# Patient Record
Sex: Female | Born: 1985 | Race: White | Hispanic: No | Marital: Single | Smoking: Never smoker
Health system: Southern US, Community
[De-identification: ages and names within clinical notes are randomized; demographics above are authoritative.]

## PROBLEM LIST (undated history)

## (undated) DIAGNOSIS — N809 Endometriosis, unspecified: Secondary | ICD-10-CM

## (undated) DIAGNOSIS — K56609 Unspecified intestinal obstruction, unspecified as to partial versus complete obstruction: Secondary | ICD-10-CM

## (undated) HISTORY — PX: APPENDECTOMY: SHX54

## (undated) HISTORY — PX: CHOLECYSTECTOMY: SHX55

---

## 2014-08-16 ENCOUNTER — Encounter (HOSPITAL_BASED_OUTPATIENT_CLINIC_OR_DEPARTMENT_OTHER): Payer: Self-pay | Admitting: Emergency Medicine

## 2014-08-16 ENCOUNTER — Emergency Department (HOSPITAL_BASED_OUTPATIENT_CLINIC_OR_DEPARTMENT_OTHER)
Admission: EM | Admit: 2014-08-16 | Discharge: 2014-08-16 | Disposition: A | Discharge status: Home / Self Care | Payer: Managed Care, Other (non HMO) | Attending: Emergency Medicine | Admitting: Emergency Medicine

## 2014-08-16 ENCOUNTER — Emergency Department (HOSPITAL_BASED_OUTPATIENT_CLINIC_OR_DEPARTMENT_OTHER): Payer: Managed Care, Other (non HMO)

## 2014-08-16 DIAGNOSIS — Z87448 Personal history of other diseases of urinary system: Secondary | ICD-10-CM | POA: Diagnosis not present

## 2014-08-16 DIAGNOSIS — J40 Bronchitis, not specified as acute or chronic: Secondary | ICD-10-CM | POA: Insufficient documentation

## 2014-08-16 DIAGNOSIS — Z8719 Personal history of other diseases of the digestive system: Secondary | ICD-10-CM | POA: Diagnosis not present

## 2014-08-16 DIAGNOSIS — R05 Cough: Secondary | ICD-10-CM | POA: Diagnosis present

## 2014-08-16 HISTORY — DX: Unspecified intestinal obstruction, unspecified as to partial versus complete obstruction: K56.609

## 2014-08-16 HISTORY — DX: Endometriosis, unspecified: N80.9

## 2014-08-16 MED ORDER — BENZONATATE 100 MG PO CAPS: 100.0000 mg | ORAL_CAPSULE | Freq: Three times a day (TID) | ORAL | Status: AC

## 2014-08-16 MED ORDER — HYDROCOD POLST-CHLORPHEN POLST 10-8 MG/5ML PO LQCR: 5.0000 mL | Freq: Two times a day (BID) | ORAL | Status: AC | PRN

## 2014-08-16 MED ORDER — ALBUTEROL SULFATE HFA 108 (90 BASE) MCG/ACT IN AERS: 2.0000 | INHALATION_SPRAY | RESPIRATORY_TRACT | Status: AC | PRN

## 2014-08-16 MED ORDER — ALBUTEROL SULFATE (2.5 MG/3ML) 0.083% IN NEBU
5.0000 mg | INHALATION_SOLUTION | Freq: Once | RESPIRATORY_TRACT | Status: AC
  Administered 2014-08-16: 5 mg via RESPIRATORY_TRACT
  Filled 2014-08-16: qty 6

## 2014-08-16 NOTE — ED Notes (Signed)
States she has been intermittently ill over the past month with cough and flu.  Has been treated by PMD in [place].Symptoms have worsened over the past few days and cough has been severe since last night.

## 2014-08-16 NOTE — ED Provider Notes (Signed)
CSN: [id number]     Arrival date & time 08/16/14  [id number] History   First MD Initiated Contact with Patient 08/16/14 1046     Chief Complaint  Patient presents with  . Cough     (Consider location/radiation/quality/duration/timing/severity/associated sxs/prior Treatment) HPI Pt presenting with c/o cough over the past 1-2 months.  She is from [place], traveling for work here.  She states her illness began with cough and she was treated with antibiotics which did not help. She has been intermittently feeling better, but after making the trip to [place] several days ago her cough has increased.  She feels she is not able to take a deep breath without feeling the need to cough.  No fever/chills.  There are no other associated systemic symptoms, there are no other alleviating or modifying factors.   Past Medical History  Diagnosis Date  . Bowel obstruction   . Endometriosis    Past Surgical History  Procedure Laterality Date  . Cholecystectomy    . Appendectomy     No family history on file. History  Substance Use Topics  . Smoking status: Never Smoker   . Smokeless tobacco: Not on file  . Alcohol Use: Yes     Comment: occasional   OB History   Grav Para Term Preterm Abortions TAB SAB Ect Mult Living                 Review of Systems ROS reviewed and all otherwise negative except for mentioned in HPI    Allergies  Prednisone and Sulfa antibiotics  Home Medications   Prior to Admission medications   Medication Sig Start Date End Date Taking? Authorizing Provider  ALPRAZolam (XANAX PO) Take by mouth as needed.   Yes Historical Provider, MD  ClonazePAM (KLONOPIN PO) Take by mouth as needed.   Yes Historical Provider, MD  CLONIDINE HCL PO Take by mouth as needed.   Yes Historical Provider, MD  albuterol (PROVENTIL HFA;VENTOLIN HFA) 108 (90 BASE) MCG/ACT inhaler Inhale 2 puffs into the lungs every 4 (four) hours as needed for wheezing or shortness of breath. 08/16/14   Ethelda ChickMartha K  Linker, MD  benzonatate (TESSALON) 100 MG capsule Take 1 capsule (100 mg total) by mouth every 8 (eight) hours. 08/16/14   Ethelda ChickMartha K Linker, MD  chlorpheniramine-HYDROcodone (TUSSIONEX PENNKINETIC ER) 10-8 MG/5ML LQCR Take 5 mLs by mouth every 12 (twelve) hours as needed for cough. 08/16/14   Ethelda ChickMartha K Linker, MD   BP 121/79  Pulse 88  Temp(Src) 97.9 F (36.6 C) (Oral)  Resp 18  Ht 5\' 6"  (1.676 m)  Wt 160 lb (72.576 kg)  BMI 25.84 kg/m2  SpO2 98% Vitals reviewed Physical Exam Physical Examination: General appearance - alert, well appearing, and in no distress Mental status - alert, oriented to person, place, and time Eyes - no conjunctival injection, no scleral icterus Mouth - mucous membranes moist, pharynx normal without lesions Chest - clear to auscultation, no wheezes, rales or rhonchi, symmetric air entry, frequent dry coughing, normal respiratory rate Heart - normal rate, regular rhythm, normal S1, S2, no murmurs, rubs, clicks or gallops Abdomen - soft, nontender, nondistended, no masses or organomegaly Extremities - peripheral pulses normal, no pedal edema, no clubbing or cyanosis Skin - normal coloration and turgor, no rashes  ED Course  Procedures (including critical care time) Labs Review Labs Reviewed - No data to display  Imaging Review Dg Chest 2 View  08/16/2014   CLINICAL DATA:  Cough and congestion.  EXAM: CHEST  2 VIEW  COMPARISON:  None.  FINDINGS: 38 degrees of dextroconvex thoracic scoliosis as measured between T7 and T12.  The lungs appear clear. Cardiac and mediastinal contours normal. No pleural effusion identified.  IMPRESSION: 1. Dextroconvex thoracic scoliosis. Otherwise, no significant abnormalities are observed.   Electronically Signed   By: Herbie BaltimoreWalt  Liebkemann M.D.   On: 08/16/2014 10:23     EKG Interpretation None      MDM   Final diagnoses:  Bronchitis    Pt presenting with c/o cough over the past 4-6 weeks.  Pt initially treated with abx, CXR  shows no sign of pneumonia or other acute abnormalities.   Xray images reviewed and interpreted by me as well.  Pt is from [place], but doubt histoplasmosis based on nontoxic appearance and normal cxr findings.  She has a normal exam, no wheezing, but does have frequent cough.  No tachypnea or hypoxia.  Pt treated with albuterol neb in the ED which did provided some relief.  rx for tussionex and tessalon perles as well as albuterol MDI for control of symptoms.  Discharged with strict return precautions.  Pt agreeable with plan.    Ethelda ChickMartha K Linker, MD 08/16/14 [phone]

## 2014-08-16 NOTE — ED Notes (Signed)
MD at bedside. 

## 2014-08-16 NOTE — Discharge Instructions (Signed)
Return to the ED with any concerns including difficulty breathing despite using albuterol inhaler 2 puffs every 4 hours, chest pain, fainting, vomiting and not able to keep down liquids, decreased level of alertness/lethargy, or any other alarming symptoms

## 2014-12-01 IMAGING — CR DG CHEST 2V
2 series · 2 of 2 positions shown · non-contrast
Comparison: None.

CLINICAL DATA: Cough and congestion.

EXAM:
CHEST  2 VIEW

[w chest pa]
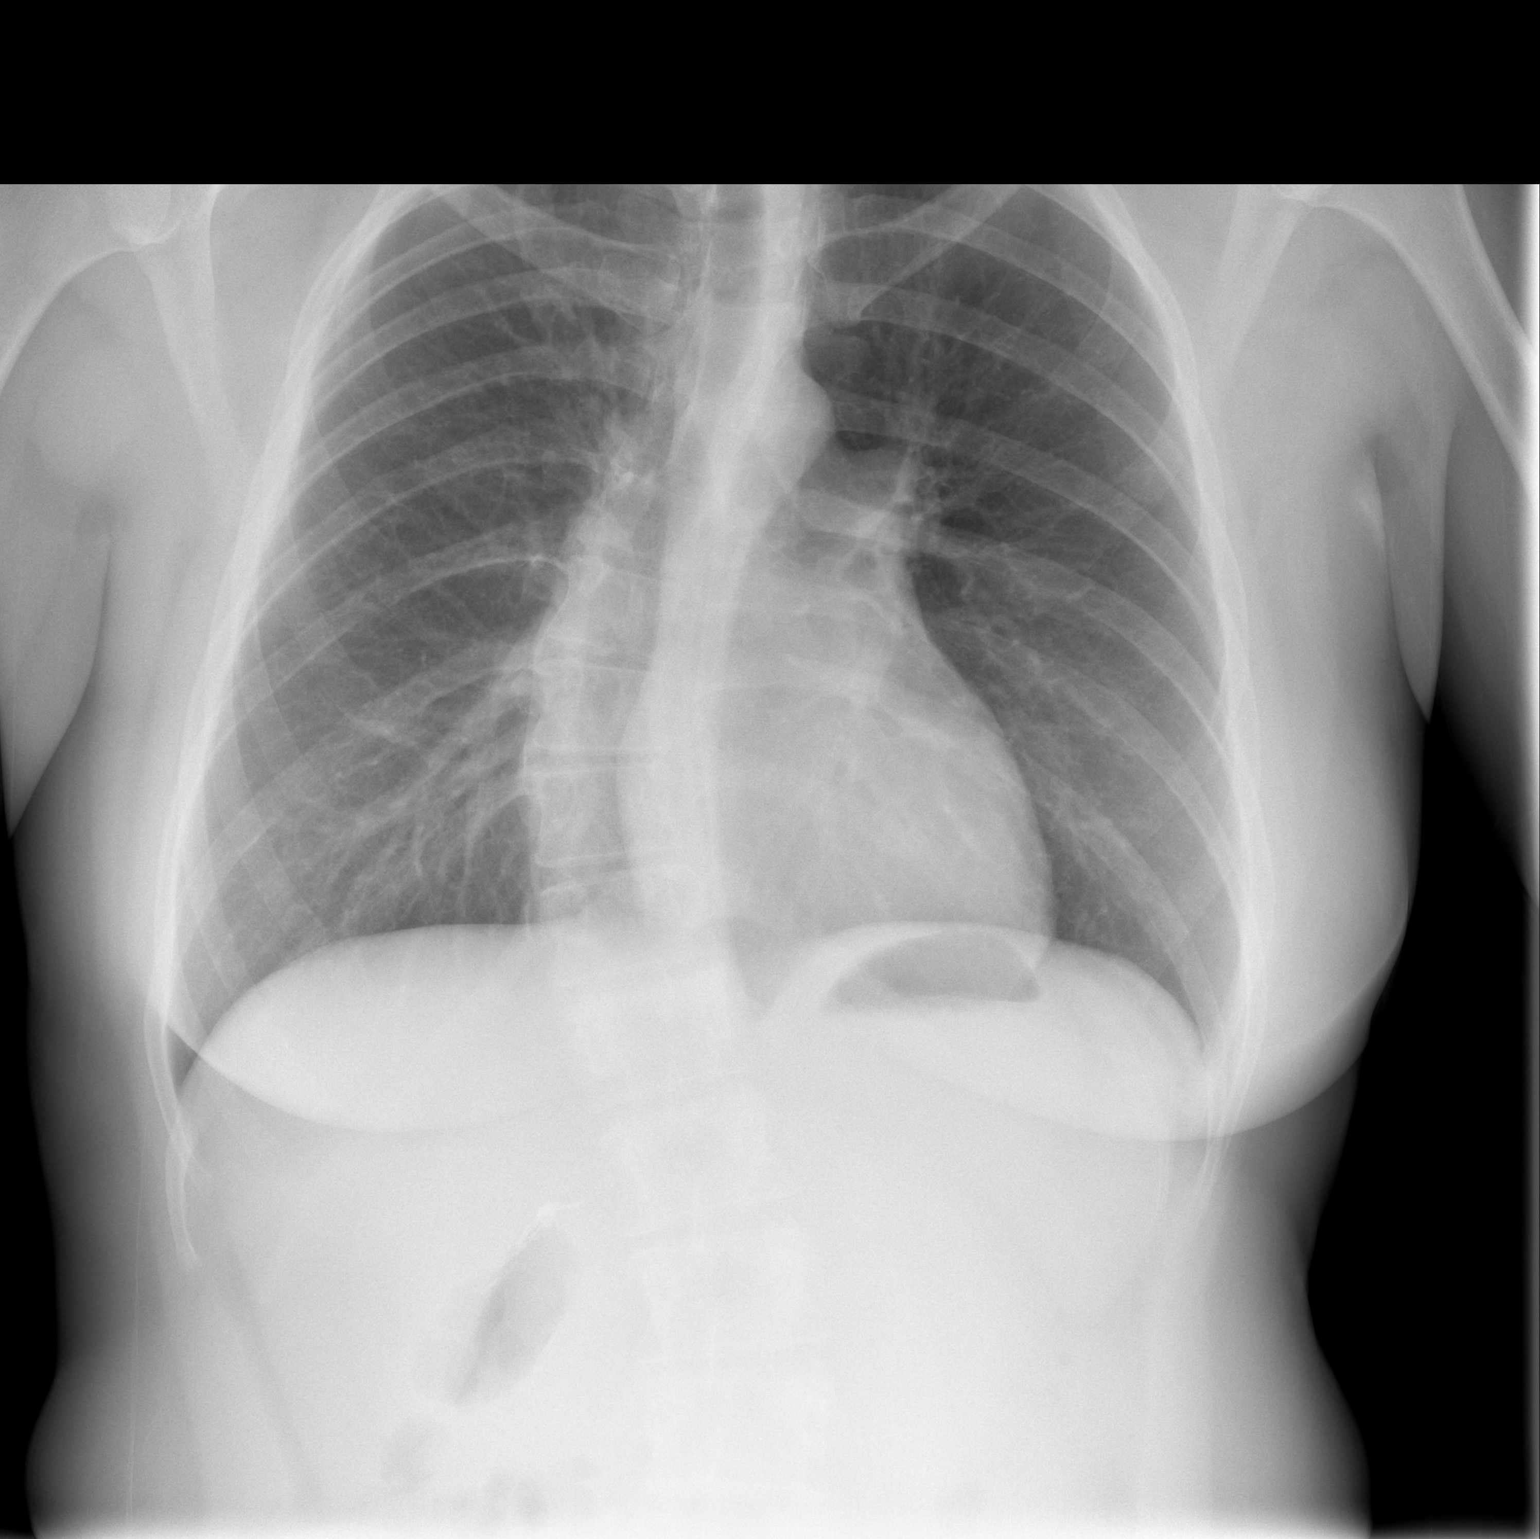

[w chest lat]
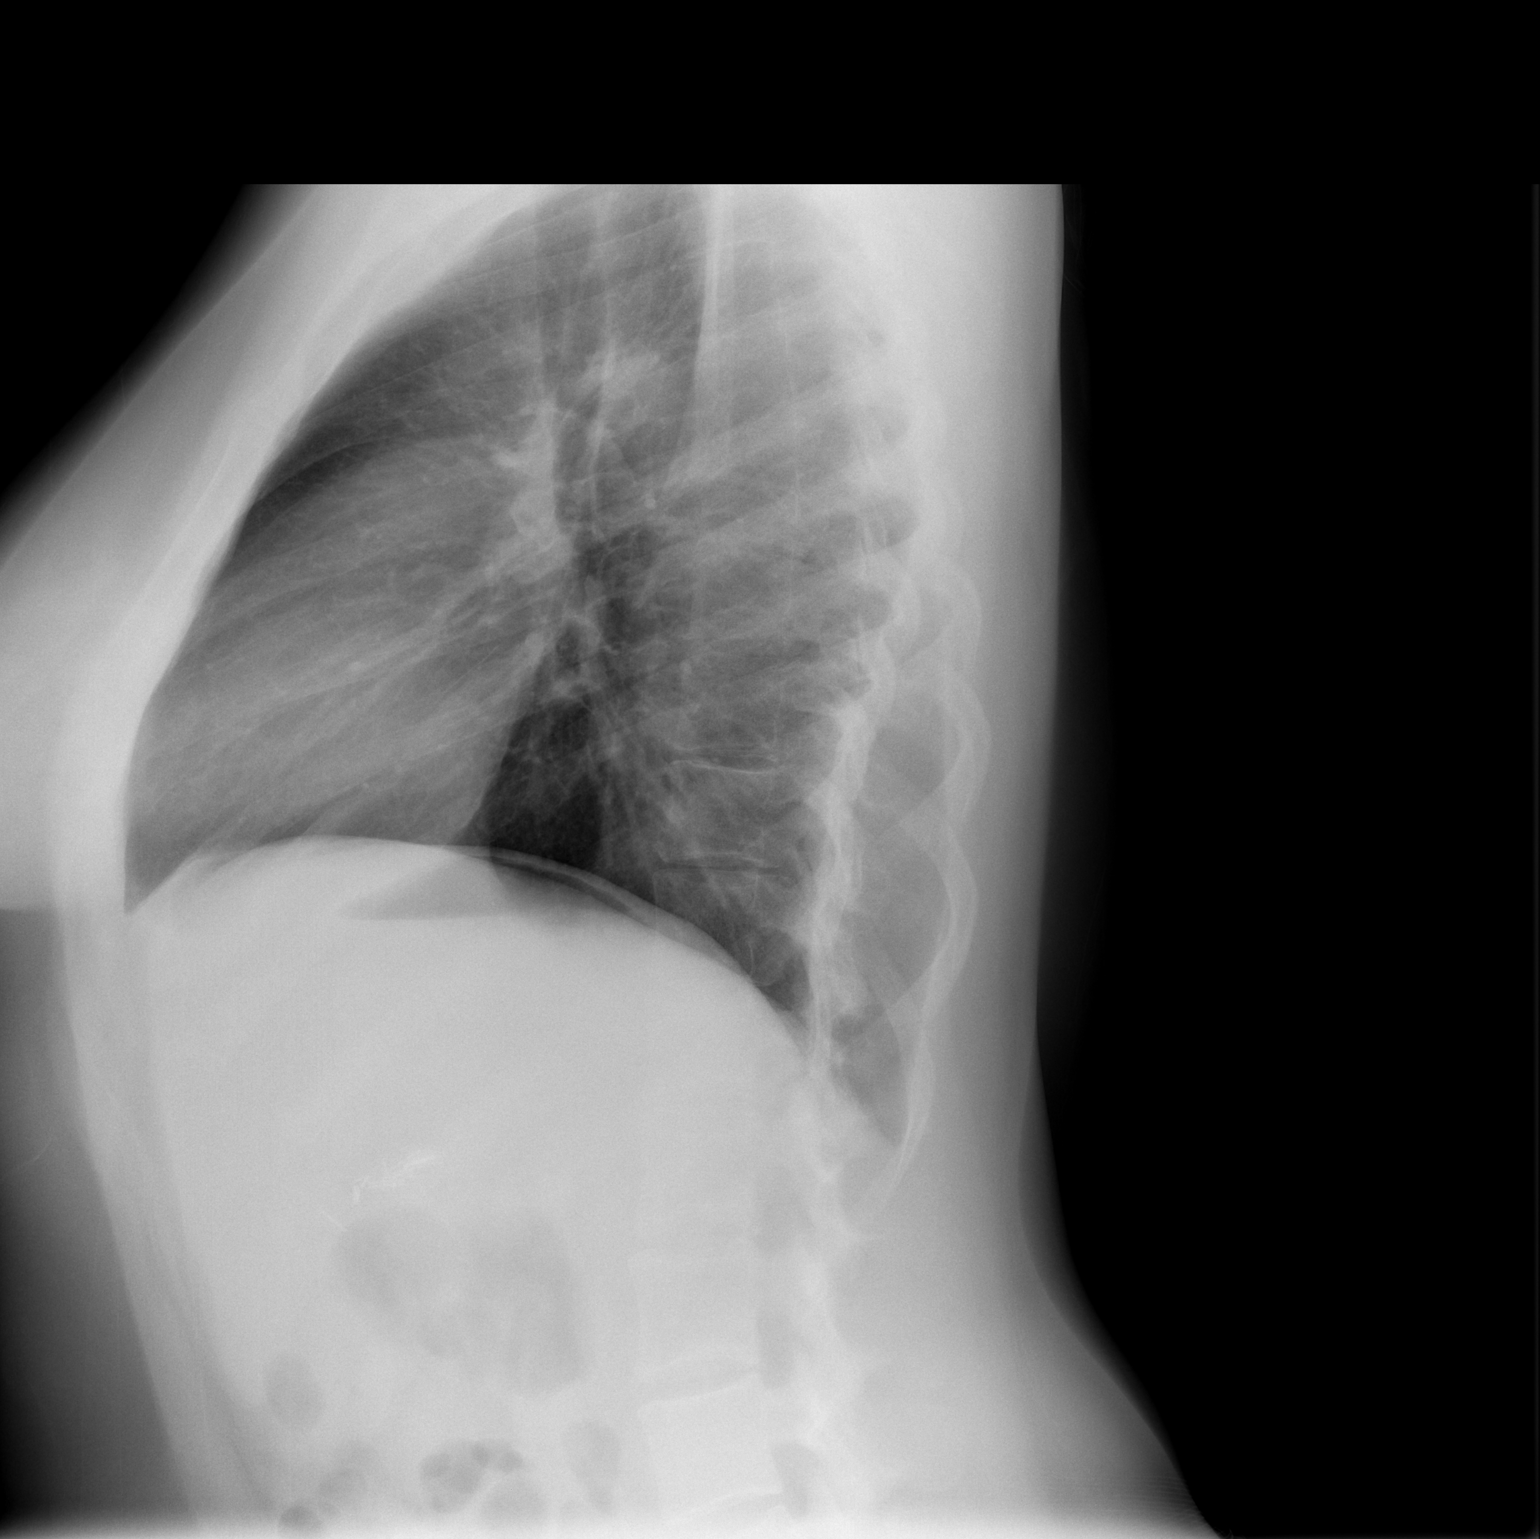

[2 of 2 positions shown; findings below may reference images not displayed]

FINDINGS: 38 degrees of dextroconvex thoracic scoliosis as measured between T7
and T12.

The lungs appear clear. Cardiac and mediastinal contours normal. No
pleural effusion identified.
IMPRESSION: 1. Dextroconvex thoracic scoliosis. Otherwise, no significant
abnormalities are observed.
